# Patient Record
Sex: Female | Born: 1970 | Race: White | Hispanic: No | Marital: Single | Smoking: Never smoker
Health system: Southern US, Community
[De-identification: ages and names within clinical notes are randomized; demographics above are authoritative.]

## PROBLEM LIST (undated history)

## (undated) DIAGNOSIS — G629 Polyneuropathy, unspecified: Secondary | ICD-10-CM

## (undated) DIAGNOSIS — C539 Malignant neoplasm of cervix uteri, unspecified: Secondary | ICD-10-CM

## (undated) HISTORY — PX: ABDOMINAL HYSTERECTOMY: SHX81

---

## 2017-11-26 ENCOUNTER — Encounter (HOSPITAL_BASED_OUTPATIENT_CLINIC_OR_DEPARTMENT_OTHER): Payer: Self-pay | Admitting: *Deleted

## 2017-11-26 ENCOUNTER — Emergency Department (HOSPITAL_BASED_OUTPATIENT_CLINIC_OR_DEPARTMENT_OTHER): Payer: Worker's Compensation

## 2017-11-26 ENCOUNTER — Other Ambulatory Visit: Payer: Self-pay

## 2017-11-26 ENCOUNTER — Emergency Department (HOSPITAL_BASED_OUTPATIENT_CLINIC_OR_DEPARTMENT_OTHER)
Admission: EM | Admit: 2017-11-26 | Discharge: 2017-11-26 | Disposition: A | Discharge status: Home / Self Care | Payer: Worker's Compensation | Attending: Emergency Medicine | Admitting: Emergency Medicine

## 2017-11-26 DIAGNOSIS — W231XXA Caught, crushed, jammed, or pinched between stationary objects, initial encounter: Secondary | ICD-10-CM | POA: Diagnosis not present

## 2017-11-26 DIAGNOSIS — Z8541 Personal history of malignant neoplasm of cervix uteri: Secondary | ICD-10-CM | POA: Diagnosis not present

## 2017-11-26 DIAGNOSIS — Y9389 Activity, other specified: Secondary | ICD-10-CM | POA: Insufficient documentation

## 2017-11-26 DIAGNOSIS — S62522B Displaced fracture of distal phalanx of left thumb, initial encounter for open fracture: Secondary | ICD-10-CM | POA: Insufficient documentation

## 2017-11-26 DIAGNOSIS — Z79899 Other long term (current) drug therapy: Secondary | ICD-10-CM | POA: Insufficient documentation

## 2017-11-26 DIAGNOSIS — Y990 Civilian activity done for income or pay: Secondary | ICD-10-CM | POA: Diagnosis not present

## 2017-11-26 DIAGNOSIS — Z23 Encounter for immunization: Secondary | ICD-10-CM | POA: Diagnosis not present

## 2017-11-26 DIAGNOSIS — Y9289 Other specified places as the place of occurrence of the external cause: Secondary | ICD-10-CM | POA: Diagnosis not present

## 2017-11-26 DIAGNOSIS — S62639B Displaced fracture of distal phalanx of unspecified finger, initial encounter for open fracture: Secondary | ICD-10-CM

## 2017-11-26 DIAGNOSIS — S6992XA Unspecified injury of left wrist, hand and finger(s), initial encounter: Secondary | ICD-10-CM | POA: Diagnosis present

## 2017-11-26 HISTORY — DX: Polyneuropathy, unspecified: G62.9

## 2017-11-26 HISTORY — DX: Malignant neoplasm of cervix uteri, unspecified: C53.9

## 2017-11-26 MED ORDER — OXYCODONE HCL 5 MG PO TABS: 5.0000 mg | ORAL_TABLET | Freq: Four times a day (QID) | ORAL | 0 refills | Status: AC | PRN

## 2017-11-26 MED ORDER — CEFAZOLIN SODIUM 1 G IJ SOLR
INTRAMUSCULAR | Status: AC
  Filled 2017-11-26: qty 20

## 2017-11-26 MED ORDER — CEFAZOLIN SODIUM-DEXTROSE 2-4 GM/100ML-% IV SOLN
2.0000 g | Freq: Once | INTRAVENOUS | Status: AC
  Administered 2017-11-26: 2 g via INTRAVENOUS
  Filled 2017-11-26: qty 100

## 2017-11-26 MED ORDER — BUPIVACAINE HCL 0.5 % IJ SOLN
50.0000 mL | Freq: Once | INTRAMUSCULAR | Status: AC
  Administered 2017-11-26: 50 mL
  Filled 2017-11-26: qty 1

## 2017-11-26 MED ORDER — AMOXICILLIN-POT CLAVULANATE 875-125 MG PO TABS: 1.0000 | ORAL_TABLET | Freq: Two times a day (BID) | ORAL | 0 refills | Status: AC

## 2017-11-26 MED ORDER — FENTANYL CITRATE (PF) 100 MCG/2ML IJ SOLN
50.0000 ug | Freq: Once | INTRAMUSCULAR | Status: AC
  Administered 2017-11-26: 50 ug via INTRAVENOUS
  Filled 2017-11-26: qty 2

## 2017-11-26 MED ORDER — IBUPROFEN 200 MG PO TABS: 600.0000 mg | ORAL_TABLET | Freq: Four times a day (QID) | ORAL | Status: AC

## 2017-11-26 MED ORDER — ACETAMINOPHEN 325 MG PO TABS: 650.0000 mg | ORAL_TABLET | Freq: Four times a day (QID) | ORAL | Status: AC

## 2017-11-26 MED ORDER — LIDOCAINE HCL 2 % IJ SOLN: 10.0000 mL | Freq: Once | INTRAMUSCULAR | Status: DC

## 2017-11-26 MED ORDER — TETANUS-DIPHTH-ACELL PERTUSSIS 5-2.5-18.5 LF-MCG/0.5 IM SUSP
0.5000 mL | Freq: Once | INTRAMUSCULAR | Status: AC
  Administered 2017-11-26: 0.5 mL via INTRAMUSCULAR
  Filled 2017-11-26: qty 0.5

## 2017-11-26 NOTE — ED Notes (Signed)
Awaiting ortho MD

## 2017-11-26 NOTE — ED Triage Notes (Signed)
Pt c/o left thumb injury x 3 hrs ago

## 2017-11-26 NOTE — ED Provider Notes (Signed)
[facility] EMERGENCY DEPARTMENT Provider Note   CSN: [id number] Arrival date & time: 11/26/17  1236     History   Chief Complaint Chief Complaint  Patient presents with  . Finger Injury    HPI Brooklen Runquist is a 47 y.o. female.  The history is provided by the patient. No language interpreter was used.   Sintia Mckissic is a 47 y.o. female who presents to the Emergency Department complaining of finger injury.  She is right-hand dominant and was at work today when her left thumb was crushed between a metal forklift and a metal handle.  The injury happened about 1130 today.  She reports severe pain to the thumb.  She has a history of anemia and cervical cancer, thought to be in remission.  No additional medical problems.  Symptoms are severe and constant. Past Medical History:  Diagnosis Date  . Cervical ca ([place])   . Neuropathy     There are no active problems to display for this patient.   Past Surgical History:  Procedure Laterality Date  . ABDOMINAL HYSTERECTOMY      OB History    No data available       Home Medications    Prior to Admission medications   Medication Sig Start Date End Date Taking? Authorizing Provider  DULoxetine (CYMBALTA) 60 MG capsule Take 60 mg by mouth daily.   Yes [provider]  estrogens, conjugated, (PREMARIN) 0.3 MG tablet Take 0.3 mg by mouth daily. Take daily for 21 days then do not take for 7 days.   Yes [provider]  acetaminophen (TYLENOL) 325 MG tablet Take 2 tablets (650 mg total) by mouth every 6 (six) hours. 11/26/17   Milly Jakob, MD  amoxicillin-clavulanate (AUGMENTIN) 875-125 MG tablet Take 1 tablet by mouth 2 (two) times daily for 5 days. 11/26/17 12/01/17  Milly Jakob, MD  ibuprofen (ADVIL) 200 MG tablet Take 3 tablets (600 mg total) by mouth every 6 (six) hours. 11/26/17   Milly Jakob, MD  oxyCODONE (ROXICODONE) 5 MG immediate release tablet Take 1 tablet (5 mg total) by mouth every 6  (six) hours as needed for severe pain. 11/26/17   Milly Jakob, MD    Family History No family history on file.  Social History Social History   Tobacco Use  . Smoking status: Never Smoker  . Smokeless tobacco: Never Used  Substance Use Topics  . Alcohol use: No    Frequency: Never  . Drug use: No     Allergies   Patient has no known allergies.   Review of Systems Review of Systems  All other systems reviewed and are negative.    Physical Exam Updated Vital Signs BP 128/87 (BP Location: Right Arm)   Pulse 70   Temp 98 F (36.7 C)   Resp 18   Ht 5\' 8"  (1.727 m)   Wt 102.1 kg (225 lb)   SpO2 99%   BMI 34.21 kg/m   Physical Exam  Constitutional: She is oriented to person, place, and time. She appears well-developed and well-nourished.  HENT:  Head: Normocephalic and atraumatic.  Cardiovascular: Normal rate and regular rhythm.  Pulmonary/Chest: Effort normal. No respiratory distress.  Musculoskeletal:  2+ radial pulses bilaterally.  There is a deep laceration at the base of the left thumbnail.  There is no significant swelling or tenderness to the IP joint or MCP joint.  Flexion and extension is intact in both joints.  There is  decreased sensation at throughout the pad of the left thumb.  Neurological: She is alert and oriented to person, place, and time.  Skin: Skin is warm and dry.  Psychiatric: She has a normal mood and affect. Her behavior is normal.  Nursing note and vitals reviewed.    ED Treatments / Results  Labs (all labs ordered are listed, but only abnormal results are displayed) Labs Reviewed - No data to display  EKG  EKG Interpretation None       Radiology Dg Finger Thumb Left  Result Date: 11/26/2017 CLINICAL DATA:  Crush injury of the thumb at work today between 2 pieces of metal. EXAM: LEFT THUMB 2+V COMPARISON:  None in PACs FINDINGS: The images were obtained in a bandage. The patient has sustained a complete fracture through the  tuft of the distal phalanx. There is distraction of the distal fracture fragment from the remainder of the distal phalanx by approximately 4 mm. There is disruption of the overlying soft tissues. The IP joint space is well maintained. The proximal phalanx and the first metacarpal are intact. IMPRESSION: There is an open fracture of the distal aspect of the distal phalanx of the left thumb. Electronically Signed   By: David  Martinique M.D.   On: 11/26/2017 13:10    Procedures Procedures (including critical care time)  Medications Ordered in ED Medications  Tdap (BOOSTRIX) injection 0.5 mL (0.5 mLs Intramuscular Given 11/26/17 1500)  fentaNYL (SUBLIMAZE) injection 50 mcg (50 mcg Intravenous Given 11/26/17 1459)  ceFAZolin (ANCEF) IVPB 2g/100 mL premix (0 g Intravenous Stopped 11/26/17 1755)  bupivacaine (MARCAINE) 0.5 % (with pres) injection 50 mL (50 mLs Infiltration Given by Other 11/26/17 1549)     Initial Impression / Assessment and Plan / ED Course  I have reviewed the triage vital signs and the nursing notes.  Pertinent labs & imaging results that were available during my care of the patient were reviewed by me and considered in my medical decision making (see chart for details).     Patient here for evaluation of injury to her left thumb.  Discussed the case with Dr. Grandville Silos with hand surgery who evaluated the patient in the emergency department and he performed the repair.  Plan to discharge patient home with outpatient follow-up and return precautions.  Final Clinical Impressions(s) / ED Diagnoses   Final diagnoses:  Open fracture of tuft of distal phalanx of finger    ED Discharge Orders        Ordered    amoxicillin-clavulanate (AUGMENTIN) 875-125 MG tablet  2 times daily     11/26/17 1906    oxyCODONE (ROXICODONE) 5 MG immediate release tablet  Every 6 hours PRN     11/26/17 1906    ibuprofen (ADVIL) 200 MG tablet  Every 6 hours     11/26/17 1906    acetaminophen (TYLENOL) 325  MG tablet  Every 6 hours     11/26/17 1906       Quintella Reichert, MD 11/27/17 [phone]

## 2017-11-26 NOTE — ED Notes (Signed)
ED Provider at bedside. 

## 2017-11-26 NOTE — Discharge Instructions (Addendum)
Discharge Instructions   You have a dressing with a splint incorporated in it. Move your fingers as much as possible, making a full fist and fully opening the fist. Elevate your hand to reduce pain & swelling of the digits.  Ice over the operative site may be helpful to reduce pain & swelling.  DO NOT USE HEAT. Leave the dressing in place until you return to our office.  You may shower, but keep the bandage clean & dry.  You may drive a car when you are off of prescription pain medications and can safely control your vehicle with both hands. Our office will call you to arrange follow-up   Please call [phone] during normal business hours or [phone] after hours for any problems. Including the following:  - excessive redness of the incisions - drainage for more than 4 days - fever of more than 101.5 F  *Please note that pain medications will not be refilled after hours or on weekends.  WORK STATUS:   May return to work on Monday, 12-03-17, but with no work with left hand until re-evaluated the week of 12-10-17.

## 2017-11-26 NOTE — Consult Note (Signed)
ORTHOPAEDIC CONSULTATION HISTORY & PHYSICAL REQUESTING PHYSICIAN: Quintella Reichert, MD  Chief Complaint: Left thumb trauma  HPI: Sara Jimenez is a 47 y.o. 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  Time of injury was about 1130.  She presented to the emergency department at [facility] where a digital block was performed and x-rays obtained.  Tetanus is up-to-date, she received a dose of IV antibiotics.  Past Medical History:  Diagnosis Date  . Cervical ca ([place])   . Neuropathy    Past Surgical History:  Procedure Laterality Date  . ABDOMINAL HYSTERECTOMY     Social History   Socioeconomic History  . Marital status: Single    Spouse name: None  . Number of children: None  . Years of education: None  . Highest education level: None  Social Needs  . Financial resource strain: None  . Food insecurity - worry: None  . Food insecurity - inability: None  . Transportation needs - medical: None  . Transportation needs - non-medical: None  Occupational History  . None  Tobacco Use  . Smoking status: Never Smoker  . Smokeless tobacco: Never Used  Substance and Sexual Activity  . Alcohol use: No    Frequency: Never  . Drug use: No  . Sexual activity: None  Other Topics Concern  . None  Social History Narrative  . None   No family history on file. No Known Allergies Prior to Admission medications   Medication Sig Start Date End Date Taking? Authorizing Provider  DULoxetine (CYMBALTA) 60 MG capsule Take 60 mg by mouth daily.   Yes [provider]  estrogens, conjugated, (PREMARIN) 0.3 MG tablet Take 0.3 mg by mouth daily. Take daily for 21 days then do not take for 7 days.   Yes [provider]  acetaminophen (TYLENOL) 325 MG tablet Take 2 tablets (650 mg total) by mouth every 6 (six) hours. 11/26/17   Milly Jakob, MD  amoxicillin-clavulanate  (AUGMENTIN) 875-125 MG tablet Take 1 tablet by mouth 2 (two) times daily for 5 days. 11/26/17 12/01/17  Milly Jakob, MD  ibuprofen (ADVIL) 200 MG tablet Take 3 tablets (600 mg total) by mouth every 6 (six) hours. 11/26/17   Milly Jakob, MD  oxyCODONE (ROXICODONE) 5 MG immediate release tablet Take 1 tablet (5 mg total) by mouth every 6 (six) hours as needed for severe pain. 11/26/17   Milly Jakob, MD   Dg Finger Thumb Left  Result Date: 11/26/2017 CLINICAL DATA:  Crush injury of the thumb at work today between 2 pieces of metal. EXAM: LEFT THUMB 2+V COMPARISON:  None in PACs FINDINGS: The images were obtained in a bandage. The patient has sustained a complete fracture through the tuft of the distal phalanx. There is distraction of the distal fracture fragment from the remainder of the distal phalanx by approximately 4 mm. There is disruption of the overlying soft tissues. The IP joint space is well maintained. The proximal phalanx and the first metacarpal are intact. IMPRESSION: There is an open fracture of the distal aspect of the distal phalanx of the left thumb. Electronically Signed   By: Tena Linebaugh  Martinique M.D.   On: 11/26/2017 13:10    Positive ROS: All other systems have been reviewed and were otherwise negative with the exception of those mentioned in the HPI and as above.  Physical Exam: Vitals: Refer to EMR. Constitutional:  WD, WN, NAD HEENT:  NCAT, EOMI Neuro/Psych:  Alert & oriented to person, place, and time; appropriate mood & affect Lymphatic: No generalized extremity edema or lymphadenopathy Extremities / MSK:  The extremities are normal with respect to appearance, ranges of motion, joint stability, muscle strength/tone, sensation, & perfusion except as otherwise noted:  The left thumb tip is examined.  There is subungual hematoma under the nail, and a portion of the nail lifted out of the eponychial fold.  The pulp is intact volarly, and there is likely a breech through the nail  bed.  The thumb is numb as previously digital block has been performed.  Flexion and extension at the IP joint is intact.  Assessment: Left thumb distal phalanx fracture with associated nailbed and skin lacerations  Plan: I discussed these findings with her and recommended bedside treatment.  She consented.  Digital block was already sufficient.   A tourniquet was applied to the base of the digit.  The nail plate was removed.  The wound was copiously irrigated under running water at the sink.  The thumb was then prepped with Betadine and draped in the usual fashion.  There was a transverse breech in the nailbed that extended into the soft tissue somewhat radially and ulnarly.  There was a small skin flap that was full-thickness, on the ulnar side of the wound, which was judged to be nonviable and was excisionally debrided.  A 21-gauge hypodermic needle was then driven manually from the tip of the digit, through the distal phalangeal fracture fragment and into the base fragment.  This provided for good reduction and stability of the fracture fragment.  The skin lacerations were repaired with 6-0 chromic interrupted sutures.  This measured approximately 1.5 cm total.  The nailbed laceration, which extended transversely across the entire nail bed, was then reapproximated with 6-0 chromic sutures as well.  The nail plate itself was then cleaned of adherent skin fragments and placed back into its bed, secured with 6-0 chromic sutures distally.  The hub with a hypodermic needle was removed and the needle was bent 90 degrees.  Steri-Strips were then applied to help prevent dislodgment of the pin.  With the wound thus debrided, skeletal stabilization performed, and soft tissues repaired, the skin was cleansed, the tourniquet removed, and a dressing applied with a tongue blade dorsal splint component.  She will be discharged with a prescription for Augmentin for 5 days, as well as an analgesic plan.  We will plan  for her to return to work in a week, but is my understanding she will be gone on planned vacation for that week, available for follow-up after the 14th.  She should keep the hand and dressing clean and dry in the interim.  Rayvon Char Grandville Silos, [address] [place], [place]  [address] Office: [phone] Mobile: [phone]  11/26/2017, 7:09 PM

## 2017-11-26 NOTE — ED Notes (Addendum)
Ortho MD in to see

## 2019-01-29 IMAGING — CR DG FINGER THUMB 2+V*L*
4 series · 4 of 4 positions shown · non-contrast
Comparison: None in PACs

CLINICAL DATA: Crush injury of the thumb at work today between 2
pieces of metal.

EXAM:
LEFT THUMB 2+V

[x finger pa left *]
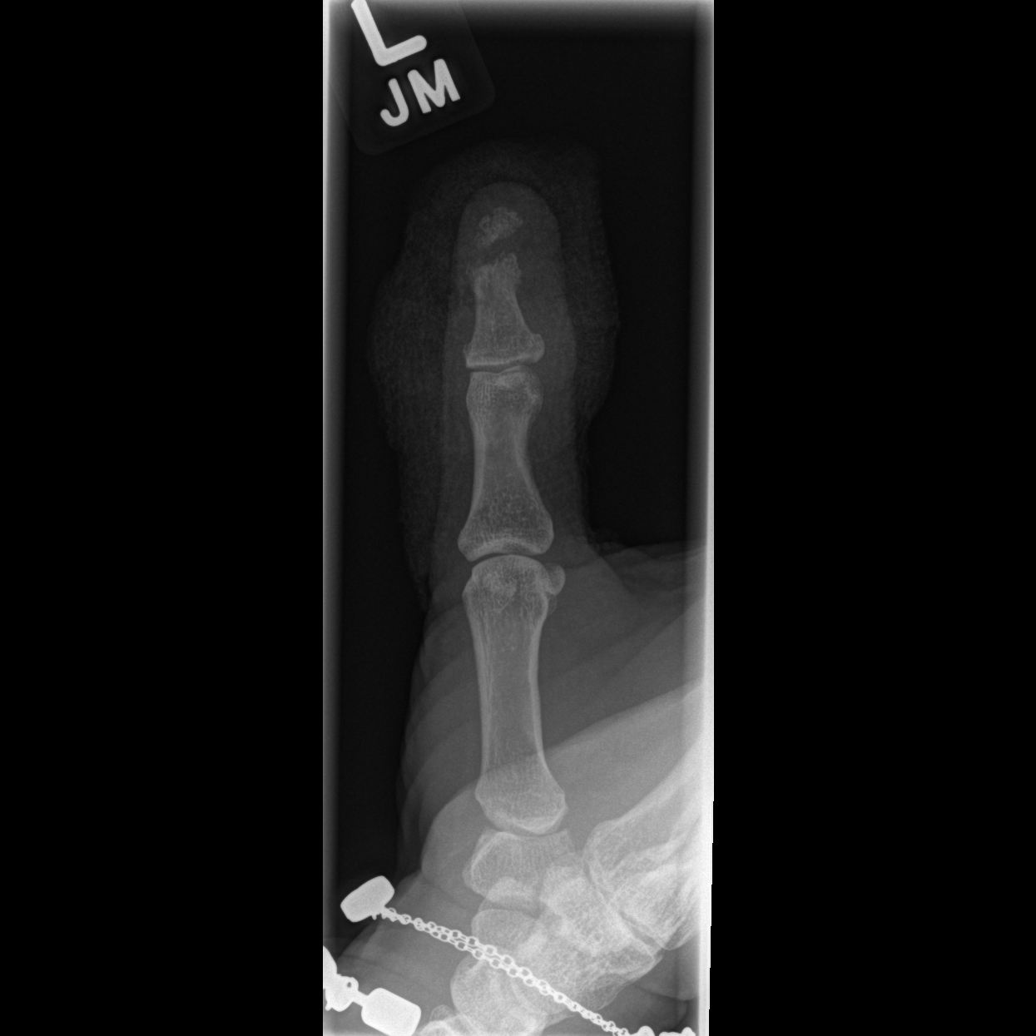

[x finger obl. left *]
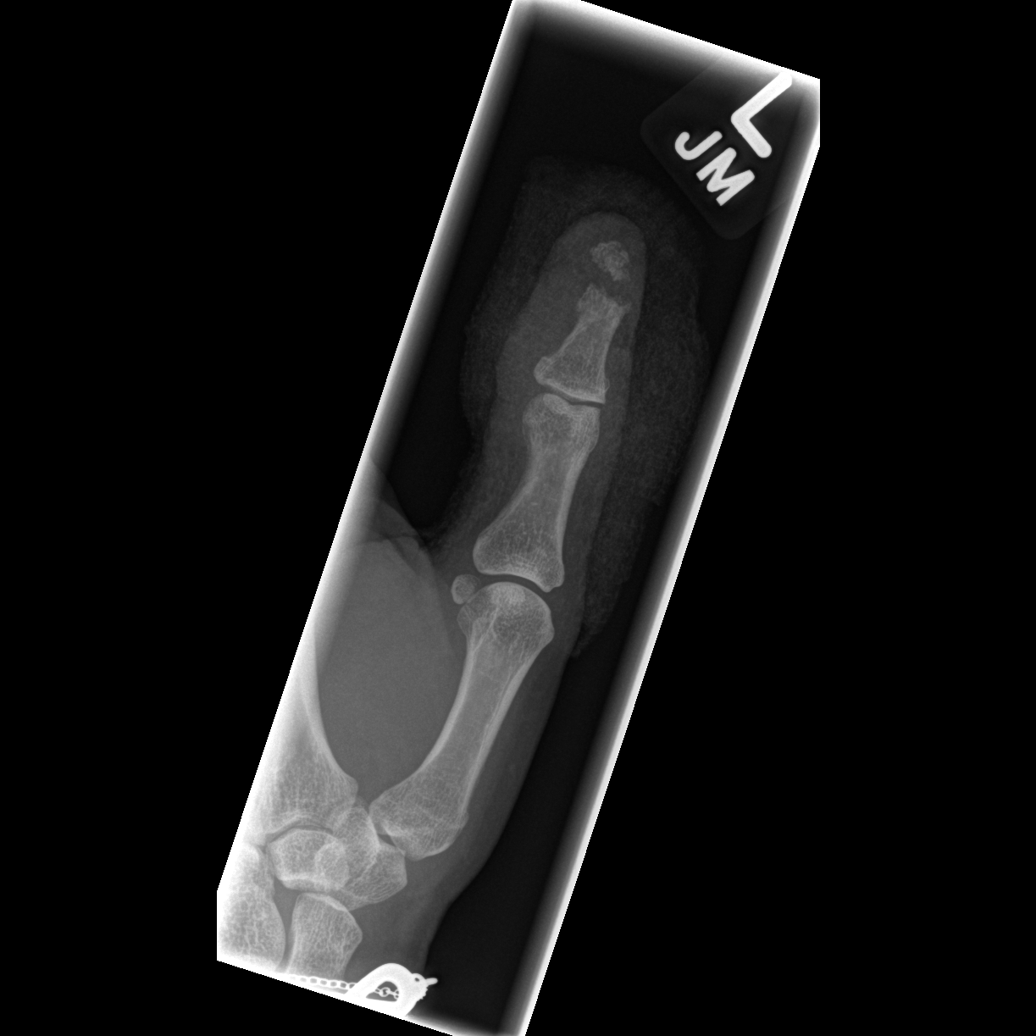

[x finger lateral left *]
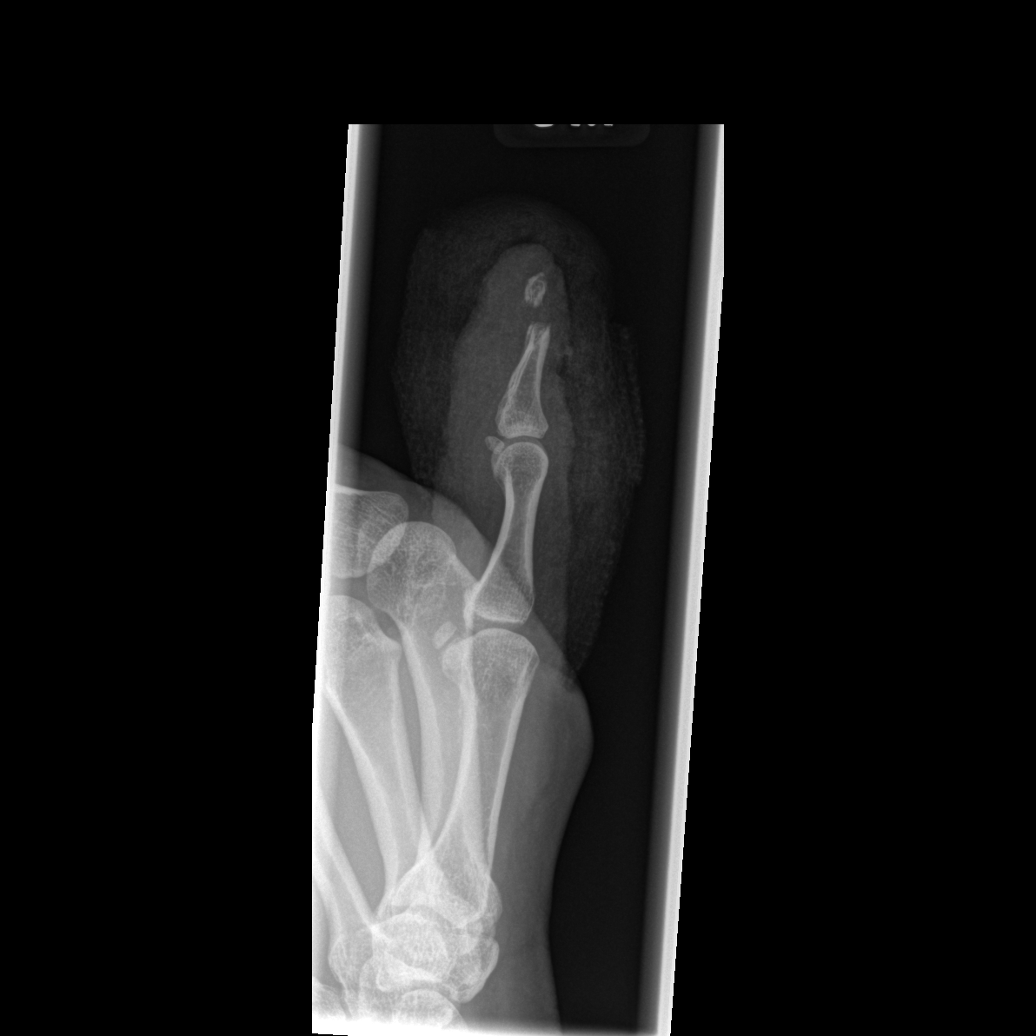

[x finger lateral left]
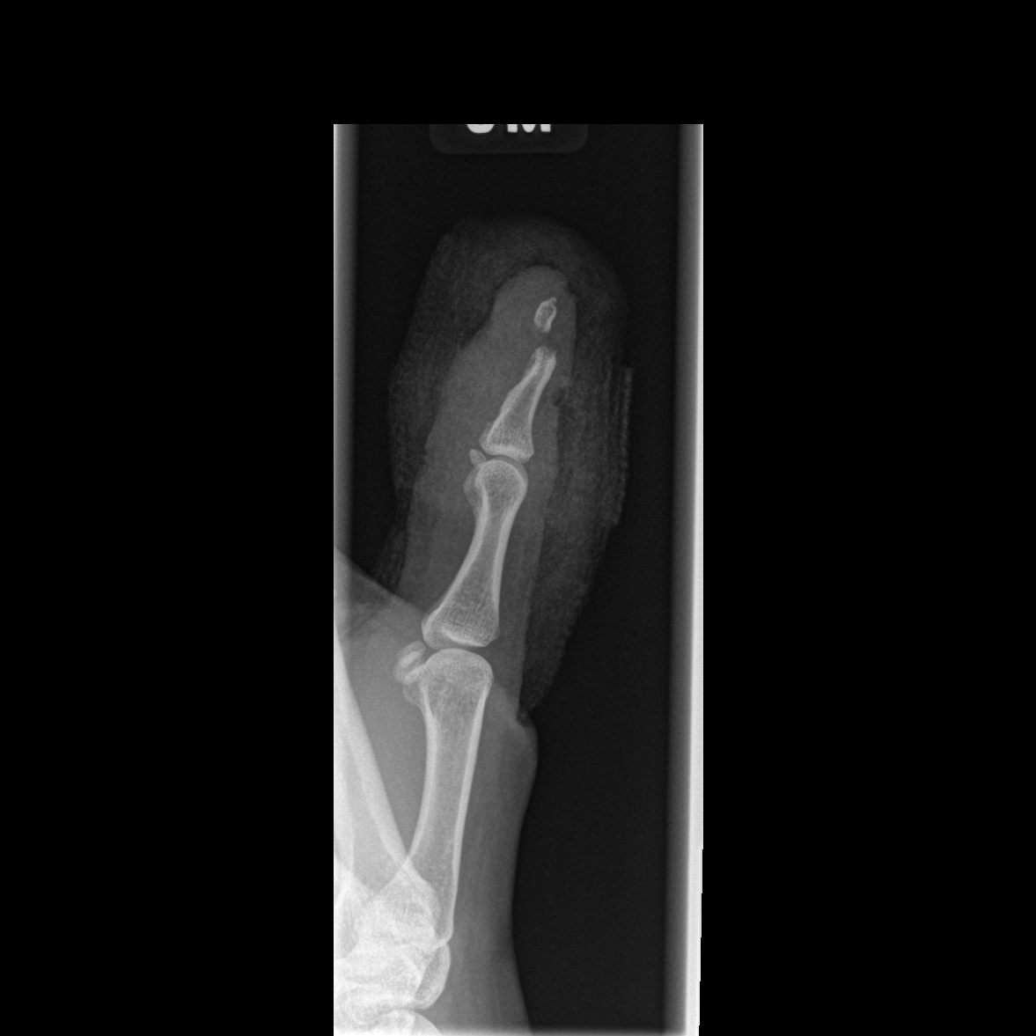

[4 of 4 positions shown; findings below may reference images not displayed]

FINDINGS: The images were obtained in a bandage. The patient has sustained a
complete fracture through the tuft of the distal phalanx. There is
distraction of the distal fracture fragment from the remainder of
the distal phalanx by approximately 4 mm. There is disruption of the
overlying soft tissues. The IP joint space is well maintained. The
proximal phalanx and the first metacarpal are intact.
IMPRESSION: There is an open fracture of the distal aspect of the distal phalanx
of the left thumb.
